# Patient Record
Sex: Female | Born: 2000 | Race: Black or African American | Hispanic: No | Marital: Single | Smoking: Never smoker
Health system: Southern US, Community
[De-identification: ages and names within clinical notes are randomized; demographics above are authoritative.]

---
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
## 2020-04-23 ENCOUNTER — Ambulatory Visit: Payer: Medicaid Other | Admitting: Pediatrics

## 2024-05-27 ENCOUNTER — Ambulatory Visit: Payer: Self-pay

## 2024-06-13 ENCOUNTER — Ambulatory Visit: Payer: Self-pay

## 2024-06-17 ENCOUNTER — Other Ambulatory Visit: Payer: Self-pay

## 2024-06-17 ENCOUNTER — Encounter: Payer: Self-pay | Admitting: Family Medicine

## 2024-06-17 ENCOUNTER — Ambulatory Visit: Payer: Self-pay | Admitting: Family Medicine

## 2024-06-17 VITALS — BP 131/73 | HR 120 | Ht 63.0 in | Wt 124.6 lb

## 2024-06-17 DIAGNOSIS — R6889 Other general symptoms and signs: Secondary | ICD-10-CM

## 2024-06-17 DIAGNOSIS — Z3201 Encounter for pregnancy test, result positive: Secondary | ICD-10-CM

## 2024-06-17 DIAGNOSIS — Z3A10 10 weeks gestation of pregnancy: Secondary | ICD-10-CM

## 2024-06-17 DIAGNOSIS — O219 Vomiting of pregnancy, unspecified: Secondary | ICD-10-CM

## 2024-06-17 LAB — POCT PREGNANCY, URINE: Preg Test, Ur: POSITIVE — AB

## 2024-06-17 MED ORDER — PROMETHAZINE HCL 25 MG PO TABS: 25.0000 mg | ORAL_TABLET | Freq: Four times a day (QID) | ORAL | 1 refills | Status: AC | PRN

## 2024-06-17 MED ORDER — GLYCOPYRROLATE 1 MG PO TABS: 1.0000 mg | ORAL_TABLET | Freq: Three times a day (TID) | ORAL | 0 refills | Status: AC

## 2024-06-17 NOTE — Patient Instructions (Addendum)
 Prenatal Care Providers           Center for [facility] @ [facility]  [address] [phone]  Center for [facility] @ [facility]   [address]  [phone]  [facility] @ [facility]       [address] [phone]            Center for [facility] @ [place]     [phone]          Center for [facility] @ [facility]   [address] [phone]  Center for [facility] @ [facility]  [address] [phone]     Center for [facility] Healthcare @ [address] [place])  [address]   [phone]     [facility]  Phone: [phone]  [facility]  Phone: [phone]  [facility] Phone: [phone]  [facility] Phone: [phone]  [facility] Phone: [phone]  [facility] Phone: [phone]  [facility]  Phone: [phone]    First Trimester of Pregnancy  The first trimester of pregnancy starts on the first day of your last monthly period until the end of week 13. This is months 1 through 3 of pregnancy. A week after a sperm fertilizes an egg, the egg will implant into the wall of the uterus and begin to develop into a baby. Body changes during your first trimester Your body goes through many changes during pregnancy. The changes usually return to normal after your baby is born. Physical changes Your breasts may grow larger and may hurt. The area around your nipples may get darker. Your periods will stop. Your hair and nails may grow faster. You may pee more often. Health changes You may tire easily. Your gums may bleed and may be sensitive when you brush and floss. You may not feel hungry. You may have heartburn. You may throw up or feel like you may throw up. You may want to eat some foods, but  not others. You may have headaches. You may have trouble pooping (constipation). Other changes Your emotions may change from day to day. You may have more dreams. Follow these instructions at home: Medicines Talk to your health care provider if you're taking medicines. Ask if the medicines are safe to take during pregnancy. Your provider may change the medicines that you take. Do not take any medicines unless told to by your provider. Take a prenatal vitamin that has at least 600 micrograms (mcg) of folic acid. Do not use herbal medicines, illegal substances, or medicines that are not approved by your provider. Eating and drinking While you're pregnant your body needs extra food for your growing baby. Talk with your provider about what to eat while pregnant. Activity Most women are able to exercise during pregnancy. Exercises may need to change as your pregnancy goes on. Talk to your provider about your activities and exercise routines. Relieving pain and discomfort Wear a good, supportive bra if your breasts hurt. Rest with your legs raised if you have leg cramps or low back pain. Safety Wear your seatbelt at all times when you're in a car. Talk to your provider if someone hits you, hurts you, or yells at you. Talk with your provider if you're feeling sad or have thoughts of hurting yourself. Lifestyle Certain things can be harmful while you're pregnant. Follow  these rules: Do not use hot tubs, steam rooms, or saunas. Do not douche. Do not use tampons or scented pads. Do not drink alcohol,smoke, vape, or use products with nicotine or tobacco in them. If you need help quitting, talk with your provider. Avoid cat litter boxes and soil used by cats. These things carry germs that can cause harm to your pregnancy and your baby. General instructions Keep all follow-up visits. It helps you and your unborn baby stay as healthy as possible. Write down your questions. Take them to your visits.  Your provider will: Talk with you about your overall health. Give you advice or refer you to specialists who can help with different needs, including: Prenatal education classes. Mental health and counseling. Foods and healthy eating. Ask for help if you need help with food. Call your dentist and ask to be seen. Brush your teeth with a soft toothbrush. Floss gently. Where to find more information American Pregnancy Association: americanpregnancy.org [employer]: acog.org Office on [employer] Health: travellesson.ca Contact a health care provider if: You feel dizzy, faint, or have a fever. You vomit or have watery poop (diarrhea) for 2 days or more. You have abnormal discharge or bleeding from your vagina. You have pain when you pee or your pee smells bad. You have cramps, pain, or pressure in your belly area. Get help right away if: You have trouble breathing or chest pain. You have any kind of injury, such as from a fall or a car crash. These symptoms may be an emergency. Get help right away. Call 911. Do not wait to see if the symptoms will go away. Do not drive yourself to the hospital. This information is not intended to replace advice given to you by your health care provider. Make sure you discuss any questions you have with your health care provider. Document Revised: 04/23/2023 Document Reviewed: 11/21/2022 Elsevier Patient Education  2024 Elsevier Inc.Morning Sickness Morning sickness is when you throw up or feel like you may throw up during pregnancy. This condition often occurs in the morning, but it can also occur at any time of day. Morning sickness is most common during the first three months of pregnancy, but it can go on throughout the pregnancy. Morning sickness is usually harmless. But if you throw up all the time, you should see your health care provider. You may also hear this condition called nausea and vomiting of  pregnancy. What are the causes? The cause of morning sickness is not known. It may be linked to changes in hormones during pregnancy. What increases the risk? You're more likely to have morning sickness if: You had morning sickness in another pregnancy. You're pregnant with more than one baby, such as twins. You had morning sickness in other pregnancies. You have had motion sickness before you were pregnant. You have had bad headaches or migraines before you were pregnant. What are the signs or symptoms? Symptoms of morning sickness include: Feeling like you may throw up. Throwing up. How is this diagnosed? Morning sickness is diagnosed based on your symptoms. How is this treated? Treatment is usually not needed for morning sickness. You may only need to change what you eat. In some cases, your provider may give you: Vitamin B6 supplements. Medicines to prevent throwing up. Ginger. Follow these instructions at home: Medicines Take your medicines only as told by your provider. Do not use any prescription, over-the-counter, or herbal medicines for morning sickness without first talking with your provider.  Take prenatal vitamins. These can stop or lessen the symptoms of morning sickness. If you feel like you may throw up after taking prenatal vitamins, take them at night or with a snack. Eating and drinking     Eat dry toast or crackers before getting out of bed. Eat 5 or 6 small meals a day. Try ginger ale made with real ginger, ginger tea, or ginger candies. Drink fluids throughout the day. Eat protein foods when you need a snack. Nuts, yogurt, and cheese are good choices. Eat dry and bland foods like rice or baked potatoes. Foods that are high in carbohydrates are often helpful. Have someone cook for you if the smell of food makes you want to throw up. Foods to avoid Greasy foods. Fatty foods. Spicy foods. General instructions Try to avoid smells that make you feel  sick. Use an air purifier to keep the air in your house free of smells. Try using an acupressure wristband. This is a wristband that's used to treat motion sickness. Try acupuncture. In this treatment, a provider puts thin needles into certain areas of your body to make you feel better. Brush your teeth after throwing up or rinse with a mix of baking soda and water. The acid in throw-up can hurt your teeth. Contact a health care provider if: Your symptoms do not get better. You feel dizzy or light-headed. You're losing weight. Get help right away if: The feeling that you may throw up will not go away, or you can't stop throwing up. You faint. You have very bad pain in your belly. This information is not intended to replace advice given to you by your health care provider. Make sure you discuss any questions you have with your health care provider. Document Revised: 04/23/2023 Document Reviewed: 10/30/2022 Elsevier Patient Education  2024 [employer].

## 2024-06-17 NOTE — Progress Notes (Signed)
 Here for pregnancy test which was positive. She declines interpreter and states she does not need interpreter. Speaks good English. She reports sure LMP 04/04/24 which makes her [redacted]w[redacted]d with EDD 01/09/25. Unable to get FHR with doppler. She denies any other pregnancies or medical issues. She reports spitting a lot and nausea and vomiting. Advised to start prenatal care with provider her choice; list placed in AVS. Advised to start prenatal vitamins.  Joesph Sear, PA in to welcome patient. She was able to see FHR with handheld ipad doppler.  She advised RX for Robinol and Phenergan- rx sent and explained to patient instructions. Sent patient to front desk to discuss options for prenatal care. She voices understanding. Rock Skip PEAK

## 2024-07-07 ENCOUNTER — Other Ambulatory Visit: Payer: Self-pay

## 2024-07-07 ENCOUNTER — Ambulatory Visit: Payer: Self-pay

## 2024-07-07 VITALS — BP 143/73 | HR 122 | Wt 126.0 lb

## 2024-07-07 DIAGNOSIS — Z3A13 13 weeks gestation of pregnancy: Secondary | ICD-10-CM

## 2024-07-07 DIAGNOSIS — O99011 Anemia complicating pregnancy, first trimester: Secondary | ICD-10-CM | POA: Diagnosis not present

## 2024-07-07 DIAGNOSIS — R7989 Other specified abnormal findings of blood chemistry: Secondary | ICD-10-CM

## 2024-07-07 DIAGNOSIS — Z3490 Encounter for supervision of normal pregnancy, unspecified, unspecified trimester: Secondary | ICD-10-CM | POA: Insufficient documentation

## 2024-07-07 DIAGNOSIS — Z3491 Encounter for supervision of normal pregnancy, unspecified, first trimester: Secondary | ICD-10-CM

## 2024-07-07 MED ORDER — PRENATAL PLUS VITAMIN/MINERAL 27-1 MG PO TABS: 1.0000 | ORAL_TABLET | Freq: Every day | ORAL | 11 refills | Status: AC

## 2024-07-07 NOTE — Patient Instructions (Signed)
 Safe Medications in Pregnancy   Acne:  Benzoyl Peroxide  Salicylic Acid   Backache/Headache:  Tylenol: 2 regular strength every 4 hours OR               2 Extra strength every 6 hours   Colds/Coughs/Allergies:  Benadryl (alcohol free) 25 mg every 6 hours as needed  Breath right strips  Claritin  Cepacol throat lozenges  Chloraseptic throat spray  Cold-Eeze- up to three times per day  Cough drops, alcohol free  Flonase (by prescription only)  Guaifenesin  Mucinex  Robitussin DM (plain only, alcohol free)  Saline nasal spray/drops  Sudafed (pseudoephedrine) & Actifed * use only after [redacted] weeks gestation and if you do not have high blood pressure  Tylenol  Vicks Vaporub  Zinc lozenges  Zyrtec   Constipation:  Colace  Ducolax suppositories  Fleet enema  Glycerin suppositories  Metamucil  Milk of magnesia  Miralax  Senokot  Smooth move tea   Diarrhea:  Kaopectate  Imodium A-D   *NO pepto Bismol   Hemorrhoids:  Anusol  Anusol HC  Preparation H  Tucks   Indigestion:  Tums  Maalox  Mylanta  Zantac   Pepcid   Insomnia:  Benadryl (alcohol free) 25mg  every 6 hours as needed  Tylenol PM  Unisom, no Gelcaps   Leg Cramps:  Tums  MagGel   Nausea/Vomiting:  Bonine  Dramamine  Emetrol  Ginger extract  Sea bands  Meclizine  Nausea medication to take during pregnancy:  Unisom (doxylamine succinate 25 mg tablets) Take one tablet daily at bedtime. If symptoms are not adequately controlled, the dose can be increased to a maximum recommended dose of two tablets daily (1/2 tablet in the morning, 1/2 tablet mid-afternoon and one at bedtime).  Vitamin B6 100mg  tablets. Take one tablet twice a day (up to 200 mg per day).   Skin Rashes:  Aveeno products  Benadryl cream or 25mg  every 6 hours as needed  Calamine Lotion  1% cortisone cream   Yeast infection:  Gyne-lotrimin 7  Monistat 7    **If taking multiple medications, please check labels to avoid  duplicating the same active ingredients  **take medication as directed on the label  ** Do not exceed 4000 mg of tylenol in 24 hours  **Do not take medications that contain aspirin or ibuprofen            Maternity Assessment [facility] --  Address: [address] [place], [place], [place] [address]

## 2024-07-07 NOTE — Progress Notes (Signed)
 New OB Intake  I connected with Rachel Davila  arrive in clinic on 07/07/24 at 11:15 AM EST in person and verified that I am speaking with the correct person using two identifiers.  I discussed the limitations, risks, security and privacy concerns of performing an evaluation and management service by telephone and the availability of in person appointments. I also discussed with the patient that there may be a patient responsible charge related to this service. The patient expressed understanding and agreed to proceed.  I explained I am completing New OB Intake today. We discussed EDD of 01/09/25 based on LMP of 04/04/24. Pt is G1P0. Patient has a dating and viability US  upon request for 07/18/24 at 1015. I reviewed her allergies, medications and Medical/Surgical/OB history.    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 Initial BP was 141/74 HR: 120 and recheck BP was 143/73 HR: 122. Patient reports slight headaches with relief with hydration and some dizziness. Denies any chest pain, SOB, edema or rapid HR. Chart review with Dr. Nicholaus who states to obtain CMP, PCR urine and TSH and review MAU precautions with patient. Provider advised during next visit during her initial prenatal appointment to review blood pressure readings. Patient verbalized understanding and MAU address provided via AVS.   Delivery Plans Plans to deliver at [facility] Discussed the nature of our practice with multiple providers including residents and students as well as female and female providers. Due to the size of the practice, the delivering provider may not be the same as those providing prenatal care.   Patient is not interested in water birth.  MyChart/Babyscripts MyChart access verified. I explained pt will have some visits in  office and some virtually. Babyscripts instructions given and order placed. Patient verifies receipt of registration text/e-mail. Account successfully created and app downloaded. If patient is a candidate for Optimized scheduling, add to sticky note.   Blood Pressure Cuff/Weight Scale Blood pressure cuff ordered for patient to pick-up from [employer]. Explained after first prenatal appt pt will check weekly and document in Babyscripts. Patient does not have weight scale; order sent to [facility], patient may track weight weekly in Babyscripts.  Anatomy US  Explained first scheduled US  will be around 19 weeks. Anatomy US  scheduled for 08/22/2024 at 2:00PM.  Is patient a CenteringPregnancy candidate?  Declined    Is patient a Mom+Baby Combined Care candidate?  Patient is unsure.   If accepted, confirm patient does not intend to move from the area for at least 12 months, then notify Mom+Baby staff  Is patient a candidate for Babyscripts Optimization? Yes, patient declined   First visit review I reviewed new OB appt with patient. Explained pt will be seen by Dr. Herchel at first visit. Discussed [place] genetic screening with patient. Patient is unsure regarding Panorama and Horizon.. Routine prenatal labs is needed: Hemoglobin A1C, possible genetic screening   Last Pap No results found for: DIAGPAP Patient to get Pap smear during initial prenatal appointment.   Rosaline Pendleton, RN 07/07/2024  4:25 PM

## 2024-07-08 DIAGNOSIS — O99019 Anemia complicating pregnancy, unspecified trimester: Secondary | ICD-10-CM | POA: Insufficient documentation

## 2024-07-08 LAB — COMPREHENSIVE METABOLIC PANEL WITH GFR
ALT: 17 IU/L (ref 0–32)
AST: 21 IU/L (ref 0–40)
Albumin: 4.1 g/dL (ref 4.0–5.0)
Alkaline Phosphatase: 118 IU/L — ABNORMAL HIGH (ref 41–116)
BUN/Creatinine Ratio: 22 (ref 9–23)
BUN: 7 mg/dL (ref 6–20)
Bilirubin Total: 0.5 mg/dL (ref 0.0–1.2)
CO2: 19 mmol/L — ABNORMAL LOW (ref 20–29)
Calcium: 9.8 mg/dL (ref 8.7–10.2)
Chloride: 102 mmol/L (ref 96–106)
Creatinine, Ser: 0.32 mg/dL — ABNORMAL LOW (ref 0.57–1.00)
Globulin, Total: 2.8 g/dL (ref 1.5–4.5)
Glucose: 106 mg/dL — ABNORMAL HIGH (ref 70–99)
Potassium: 4.9 mmol/L (ref 3.5–5.2)
Sodium: 137 mmol/L (ref 134–144)
Total Protein: 6.9 g/dL (ref 6.0–8.5)
eGFR: 149 mL/min/1.73 (ref >59)

## 2024-07-08 LAB — CBC/D/PLT+RPR+RH+ABO+RUBIGG...
Antibody Screen: NEGATIVE
Basophils Absolute: 0 x10E3/uL (ref 0.0–0.2)
Basos: 0 %
EOS (ABSOLUTE): 0 x10E3/uL (ref 0.0–0.4)
Eos: 0 %
HCV Ab: NONREACTIVE
HIV Screen 4th Generation wRfx: NONREACTIVE
Hematocrit: 33.1 % — ABNORMAL LOW (ref 34.0–46.6)
Hemoglobin: 10.8 g/dL — ABNORMAL LOW (ref 11.1–15.9)
Hepatitis B Surface Ag: NEGATIVE
Immature Grans (Abs): 0 x10E3/uL (ref 0.0–0.1)
Immature Granulocytes: 0 %
Lymphocytes Absolute: 1.1 x10E3/uL (ref 0.7–3.1)
Lymphs: 18 %
MCH: 27.8 pg (ref 26.6–33.0)
MCHC: 32.6 g/dL (ref 31.5–35.7)
MCV: 85 fL (ref 79–97)
Monocytes Absolute: 0.7 x10E3/uL (ref 0.1–0.9)
Monocytes: 11 %
Neutrophils Absolute: 4.1 x10E3/uL (ref 1.4–7.0)
Neutrophils: 70 %
Platelets: 308 x10E3/uL (ref 150–450)
RBC: 3.88 x10E6/uL (ref 3.77–5.28)
RDW: 14.3 % (ref 11.7–15.4)
RPR Ser Ql: NONREACTIVE
Rh Factor: POSITIVE
Rubella Antibodies, IGG: 6.68 {index} (ref >0.99)
WBC: 5.9 x10E3/uL (ref 3.4–10.8)

## 2024-07-08 LAB — HCV INTERPRETATION

## 2024-07-08 LAB — TSH: TSH: <0.005 u[IU]/mL — ABNORMAL LOW (ref 0.450–4.500)

## 2024-07-08 MED ORDER — FERRIC MALTOL 30 MG PO CAPS: 1.0000 | ORAL_CAPSULE | Freq: Two times a day (BID) | ORAL | 2 refills | Status: AC

## 2024-07-08 NOTE — Addendum Note (Signed)
 Addended by: HERCHEL GRUMET A on: 07/08/2024 08:55 AM   Modules accepted: Orders

## 2024-07-09 LAB — PROTEIN / CREATININE RATIO, URINE
Creatinine, Urine: 93.4 mg/dL
Protein, Ur: 29.6 mg/dL
Protein/Creat Ratio: 317 mg/g{creat} — ABNORMAL HIGH (ref 0–200)

## 2024-07-09 LAB — CULTURE, OB URINE

## 2024-07-09 LAB — URINE CULTURE, OB REFLEX

## 2024-07-11 ENCOUNTER — Ambulatory Visit: Payer: Self-pay | Admitting: Obstetrics & Gynecology

## 2024-07-11 DIAGNOSIS — O1210 Gestational proteinuria, unspecified trimester: Secondary | ICD-10-CM | POA: Insufficient documentation

## 2024-07-12 ENCOUNTER — Encounter: Payer: Self-pay | Admitting: Obstetrics & Gynecology

## 2024-07-12 ENCOUNTER — Ambulatory Visit (INDEPENDENT_AMBULATORY_CARE_PROVIDER_SITE_OTHER): Payer: Self-pay | Admitting: Obstetrics & Gynecology

## 2024-07-12 ENCOUNTER — Other Ambulatory Visit: Payer: Self-pay

## 2024-07-12 ENCOUNTER — Other Ambulatory Visit (HOSPITAL_COMMUNITY)
Admission: RE | Admit: 2024-07-12 | Discharge: 2024-07-12 | Disposition: A | Discharge status: Home / Self Care | Source: Ambulatory Visit | Attending: Obstetrics & Gynecology | Admitting: Obstetrics & Gynecology

## 2024-07-12 VITALS — BP 124/77 | HR 86 | Wt 125.0 lb

## 2024-07-12 DIAGNOSIS — Z3492 Encounter for supervision of normal pregnancy, unspecified, second trimester: Secondary | ICD-10-CM

## 2024-07-12 DIAGNOSIS — O1212 Gestational proteinuria, second trimester: Secondary | ICD-10-CM

## 2024-07-12 DIAGNOSIS — Z3A14 14 weeks gestation of pregnancy: Secondary | ICD-10-CM

## 2024-07-12 DIAGNOSIS — O26892 Other specified pregnancy related conditions, second trimester: Secondary | ICD-10-CM

## 2024-07-12 DIAGNOSIS — O99012 Anemia complicating pregnancy, second trimester: Secondary | ICD-10-CM | POA: Diagnosis not present

## 2024-07-12 DIAGNOSIS — N898 Other specified noninflammatory disorders of vagina: Secondary | ICD-10-CM | POA: Diagnosis not present

## 2024-07-12 DIAGNOSIS — O162 Unspecified maternal hypertension, second trimester: Secondary | ICD-10-CM | POA: Diagnosis not present

## 2024-07-12 DIAGNOSIS — O169 Unspecified maternal hypertension, unspecified trimester: Secondary | ICD-10-CM | POA: Insufficient documentation

## 2024-07-12 DIAGNOSIS — O1210 Gestational proteinuria, unspecified trimester: Secondary | ICD-10-CM

## 2024-07-12 LAB — ANEMIA PROFILE B
Ferritin: 319 ng/mL — ABNORMAL HIGH (ref 15–150)
Folate: >20 ng/mL (ref >3.0)
Iron Saturation: 47 % (ref 15–55)
Iron: 119 ug/dL (ref 27–159)
Total Iron Binding Capacity: 251 ug/dL (ref 250–450)
UIBC: 132 ug/dL (ref 131–425)
Vitamin B-12: 852 pg/mL (ref 232–1245)

## 2024-07-12 LAB — T3, FREE: T3, Free: 27 pg/mL (ref 2.0–4.4)

## 2024-07-12 LAB — T4, FREE: Free T4: 5.81 ng/dL — ABNORMAL HIGH (ref 0.82–1.77)

## 2024-07-12 LAB — HGB A1C W/O EAG: Hgb A1c MFr Bld: 5 % (ref 4.8–5.6)

## 2024-07-12 LAB — SPECIMEN STATUS REPORT

## 2024-07-12 MED ORDER — ASPIRIN 81 MG PO TBEC: 81.0000 mg | DELAYED_RELEASE_TABLET | Freq: Every day | ORAL | 2 refills | Status: AC

## 2024-07-12 NOTE — Progress Notes (Signed)
 INITIAL PRENATAL VISIT  History:  Rachel Davila is a 23 y.o. G1P0 at [redacted]w[redacted]d by LMP being seen today for her first obstetrical visit.  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 Patient does intend to breast feed. Pregnancy history fully reviewed.  Patient reports no complaints.  HISTORY: OB History  Gravida Para Term Preterm AB Living  1 0 0 0 0 0  SAB IAB Ectopic Multiple Live Births  0 0 0 0 0    # Outcome Date GA Lbr Len/2nd Weight Sex Type Anes PTL Lv  1 Current           Never had a pap smear or HPV vaccine.  Past Medical History:  Diagnosis Date   Dysfunctional uterine bleeding 10/07/2017   Dysmenorrhea in adolescent 10/07/2017   [facility] examination 03/02/2016   Seen at [facility] 09/21/2014  Positive TB spot, normal CXR  Need to check records with TB clinic.  Hep B sAg & core Ab: negative  Hep B S Ab: positive  HIV negative  Hgb electrophoresis: negative  Hb/Hct: 12.7/39.2  Lead: 1.07     Urine GC/Chlam- negative  RPR - negative        TB (pulmonary tuberculosis) 2016   Unintentional weight loss 10/07/2017   History reviewed. No pertinent surgical history. History reviewed. No pertinent family history. Social History   Tobacco Use   Smoking status: Never   Smokeless tobacco: Never  Vaping Use   Vaping status: Never Used  Substance Use Topics   Alcohol use: Not Currently   Drug use: Never   No Known Allergies Current Outpatient Medications on File Prior to Visit  Medication Sig Dispense Refill   Ferric Maltol  30 MG CAPS Take 1 capsule (30 mg total) by mouth 2 (two) times daily. Please take one hour before breakfast and dinner 60 capsule 2   glycopyrrolate  (ROBINUL ) 1 MG tablet Take 1 tablet (1 mg total) by mouth 3 (three) times daily. 90 tablet 0   Prenatal Vit-Fe Fumarate-FA (MULTIVITAMIN-PRENATAL) 27-0.8 MG TABS tablet Take 1 tablet by mouth daily at 12 noon. (Patient not taking: Reported on 07/12/2024)     Prenatal  Vit-Fe Fumarate-FA (PRENATAL PLUS VITAMIN/MINERAL) 27-1 MG TABS Take 1 tablet by mouth daily. (Patient not taking: Reported on 07/12/2024) 30 tablet 11   promethazine  (PHENERGAN ) 25 MG tablet Take 1 tablet (25 mg total) by mouth every 6 (six) hours as needed for nausea or vomiting. (Patient not taking: Reported on 07/12/2024) 30 tablet 1   ranitidine  (ZANTAC  75) 75 MG tablet Take 1 tablet (75 mg total) by mouth 2 (two) times daily. (Patient not taking: Reported on 07/12/2024) 60 tablet 1   No current facility-administered medications on file prior to visit.   Review of Systems Pertinent items noted in HPI and remainder of comprehensive ROS otherwise negative.  Indications for ASA therapy (per UpToDate) Two or more of the following: (Can do 81 mg daily) Nulliparity Yes Sociodemographic characteristics (African/African American race, low socioeconomic level) Yes  Physical Exam: Chaperone Rosaline Pendleton, RN   Vitals:   07/12/24 1010  BP: 124/77  Pulse: 86  Weight: 125 lb (56.7 kg)   Fetal Heart Rate (bpm): 164    General: well-developed, well-nourished female in no acute distress  Breasts:  normal appearance, no masses or tenderness bilaterally, exam done in the presence of a chaperone.   Skin: normal coloration and turgor, no rashes  Neurologic: oriented, normal, negative, normal mood  Extremities: normal strength, tone, and muscle mass, ROM of all joints is normal  HEENT PERRLA, extraocular movement intact and sclera clear, anicteric  Neck supple and no masses  Cardiovascular: regular rate and rhythm  Respiratory:  no respiratory distress, normal breath sounds  Abdomen: soft, non-tender; bowel sounds normal; no masses,  no organomegaly  Pelvic: normal external genitalia, no lesions, normal vaginal mucosa, copious yellow vaginal discharge seen and testing sample obtained, cervix with erythematous surface, pap smear done that resulted in bleeding and ameliorated with silver nitrate. Exam  done in the presence of a chaperone.    Results for orders placed or performed in visit on 07/07/24 (from the past 2 weeks)  CBC/D/Plt+RPR+Rh+ABO+RubIgG...   Collection Time: 07/07/24 12:36 PM  Result Value Ref Range   Hepatitis B Surface Ag Negative Negative   HCV Ab Non Reactive Non Reactive   RPR Ser Ql Non Reactive Non Reactive   Rubella Antibodies, IGG 6.68 Immune >0.99 index   ABO Grouping O    Rh Factor Positive    Antibody Screen Negative Negative   HIV Screen 4th Generation wRfx Non Reactive Non Reactive   WBC 5.9 3.4 - 10.8 x10E3/uL   RBC 3.88 3.77 - 5.28 x10E6/uL   Hemoglobin 10.8 (L) 11.1 - 15.9 g/dL   Hematocrit 66.8 (L) 65.9 - 46.6 %   MCV 85 79 - 97 fL   MCH 27.8 26.6 - 33.0 pg   MCHC 32.6 31.5 - 35.7 g/dL   RDW 85.6 88.2 - 84.5 %   Platelets 308 150 - 450 x10E3/uL   Neutrophils 70 Not Estab. %   Lymphs 18 Not Estab. %   Monocytes 11 Not Estab. %   Eos 0 Not Estab. %   Basos 0 Not Estab. %   Neutrophils Absolute 4.1 1.4 - 7.0 x10E3/uL   Lymphocytes Absolute 1.1 0.7 - 3.1 x10E3/uL   Monocytes Absolute 0.7 0.1 - 0.9 x10E3/uL   EOS (ABSOLUTE) 0.0 0.0 - 0.4 x10E3/uL   Basophils Absolute 0.0 0.0 - 0.2 x10E3/uL   Immature Granulocytes 0 Not Estab. %   Immature Grans (Abs) 0.0 0.0 - 0.1 x10E3/uL  Comp Met (CMET)   Collection Time: 07/07/24 12:36 PM  Result Value Ref Range   Glucose 106 (H) 70 - 99 mg/dL   BUN 7 6 - 20 mg/dL   Creatinine, Ser 9.67 (L) 0.57 - 1.00 mg/dL   eGFR 850 >40 fO/fpw/8.26   BUN/Creatinine Ratio 22 9 - 23   Sodium 137 134 - 144 mmol/L   Potassium 4.9 3.5 - 5.2 mmol/L   Chloride 102 96 - 106 mmol/L   CO2 19 (L) 20 - 29 mmol/L   Calcium 9.8 8.7 - 10.2 mg/dL   Total Protein 6.9 6.0 - 8.5 g/dL   Albumin 4.1 4.0 - 5.0 g/dL   Globulin, Total 2.8 1.5 - 4.5 g/dL   Bilirubin Total 0.5 0.0 - 1.2 mg/dL   Alkaline Phosphatase 118 (H) 41 - 116 IU/L   AST 21 0 - 40 IU/L   ALT 17 0 - 32 IU/L  TSH   Collection Time: 07/07/24 12:36 PM  Result  Value Ref Range   TSH <0.005 (L) 0.450 - 4.500 uIU/mL  Interpretation:   Collection Time: 07/07/24 12:36 PM  Result Value Ref Range   HCV Interp 1: Comment   Culture, OB Urine   Collection Time: 07/07/24 12:45 PM   Specimen: Urine   UC  Result Value Ref Range   Urine Culture, OB Final  report   Urine Culture, OB Reflex   Collection Time: 07/07/24 12:45 PM  Result Value Ref Range   Organism ID, Bacteria Comment   Protein / creatinine ratio, urine   Collection Time: 07/07/24 12:46 PM  Result Value Ref Range   Creatinine, Urine 93.4 Not Estab. mg/dL   Protein, Ur 70.3 Not Estab. 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 Vaginal discharge during pregnancy in second trimester - Cervicovaginal ancillary done, will follow up results and manage accordingly.  2. Anemia during pregnancy in second trimester Hemoglobin 10.8, oral iron prescribed (Accrufer ).  3. Elevated blood pressure during intake visit, antepartum BP Readings from Last 3 Encounters:  07/12/24 124/77  07/07/24 (!) 143/73  06/17/24 131/73  Normotensive today, continue to montior closely.  4. Proteinuria during pregnancy Urine Pr:Cr was 317 at 13 weeks, continue to monitor BP closely and recheck as needed. BUN/Cr 7/0.32. No history of any renal disorders, but will monitor closely  5. [redacted] weeks gestation of pregnancy 6. Encounter for supervision of low-risk pregnancy in second trimester (Primary) Remaining prenatal labs, pap done; will follow up results and manage accordingly. - Cytology - PAP - HORIZON Basic Panel - PANORAMA PRENATAL TEST - Hemoglobin A1c - Anemia  Profile B - T4, free - aspirin  EC 81 MG tablet; Take 1 tablet (81 mg total) by mouth at bedtime. Start taking when you are [redacted] weeks pregnant for rest of pregnancy for prevention of preeclampsia  Dispense: 300 tablet; Refill: 2 - T3, free - Enroll Patient in PreNatal Babyscripts - Babyscripts Schedule Optimization - Cervicovaginal ancillary only  Continue prenatal vitamins. Problem list reviewed and updated. Genetic Screening discussed, Panorama and Horizon: ordered. Ultrasound discussed; fetal anatomic survey: scheduled. Anticipatory guidance about prenatal visits given including labs, ultrasounds, and testing. Weight gain recommendations per IOM guidelines reviewed: underweight/BMI 18.5 or less > 28 - 40 lbs; normal weight/BMI 18.5 - 24.9 > 25 - 35 lbs; overweight/BMI 25 - 29.9 > 15 - 25 lbs; obese/BMI 30 or more > 11 - 20 lbs. Discussed usage of the Babyscripts app for more information about pregnancy, and to track blood pressures. Also discussed usage of virtual visits as additional source of managing and completing prenatal visits.  Patient was encouraged to use MyChart to review results, send requests, and have questions addressed.   The nature of [facility] with multiple MDs and Advanced Practice Providers was explained to patient; also emphasized that residents, students are part of our team. Routine obstetric precautions reviewed. Encouraged to seek out care at our office or emergency room [facility] preferred) for urgent and/or emergent concerns. Return in about 4 weeks (around 08/09/2024) for OFFICE OB VISIT (MD or APP).     GLORIS HUGGER, MD, FACOG Obstetrician & Gynecologist, [facility] for [employer], [facility]

## 2024-07-13 ENCOUNTER — Ambulatory Visit: Payer: Self-pay | Admitting: Obstetrics & Gynecology

## 2024-07-13 DIAGNOSIS — E0590 Thyrotoxicosis, unspecified without thyrotoxic crisis or storm: Secondary | ICD-10-CM

## 2024-07-13 DIAGNOSIS — O0992 Supervision of high risk pregnancy, unspecified, second trimester: Secondary | ICD-10-CM

## 2024-07-13 DIAGNOSIS — O23592 Infection of other part of genital tract in pregnancy, second trimester: Secondary | ICD-10-CM

## 2024-07-13 LAB — ANEMIA PROFILE B
Basophils Absolute: 0 x10E3/uL (ref 0.0–0.2)
Basos: 0 %
EOS (ABSOLUTE): 0.1 x10E3/uL (ref 0.0–0.4)
Eos: 1 %
Ferritin: 276 ng/mL — ABNORMAL HIGH (ref 15–150)
Folate: 13.3 ng/mL (ref >3.0)
Hematocrit: 32.2 % — ABNORMAL LOW (ref 34.0–46.6)
Hemoglobin: 10.2 g/dL — ABNORMAL LOW (ref 11.1–15.9)
Immature Grans (Abs): 0 x10E3/uL (ref 0.0–0.1)
Immature Granulocytes: 0 %
Iron Saturation: 32 % (ref 15–55)
Iron: 84 ug/dL (ref 27–159)
Lymphocytes Absolute: 1.6 x10E3/uL (ref 0.7–3.1)
Lymphs: 21 %
MCH: 27.2 pg (ref 26.6–33.0)
MCHC: 31.7 g/dL (ref 31.5–35.7)
MCV: 86 fL (ref 79–97)
Monocytes Absolute: 0.9 x10E3/uL (ref 0.1–0.9)
Monocytes: 11 %
Neutrophils Absolute: 5.2 x10E3/uL (ref 1.4–7.0)
Neutrophils: 67 %
Platelets: 290 x10E3/uL (ref 150–450)
RBC: 3.75 x10E6/uL — ABNORMAL LOW (ref 3.77–5.28)
RDW: 15 % (ref 11.7–15.4)
Retic Ct Pct: 1.6 % (ref 0.6–2.6)
Total Iron Binding Capacity: 261 ug/dL (ref 250–450)
UIBC: 177 ug/dL (ref 131–425)
Vitamin B-12: 810 pg/mL (ref 232–1245)
WBC: 7.9 x10E3/uL (ref 3.4–10.8)

## 2024-07-13 LAB — CERVICOVAGINAL ANCILLARY ONLY
Bacterial Vaginitis (gardnerella): POSITIVE — AB
Candida Glabrata: NEGATIVE
Candida Vaginitis: POSITIVE — AB
Chlamydia: NEGATIVE
Comment: NEGATIVE
Comment: NEGATIVE
Comment: NEGATIVE
Comment: NEGATIVE
Comment: NEGATIVE
Comment: NORMAL
Neisseria Gonorrhea: NEGATIVE
Trichomonas: NEGATIVE

## 2024-07-13 LAB — HEMOGLOBIN A1C
Est. average glucose Bld gHb Est-mCnc: 97 mg/dL
Hgb A1c MFr Bld: 5 % (ref 4.8–5.6)

## 2024-07-13 LAB — T4, FREE: Free T4: 5.03 ng/dL — ABNORMAL HIGH (ref 0.82–1.77)

## 2024-07-13 LAB — T3, FREE: T3, Free: 24.3 pg/mL (ref 2.0–4.4)

## 2024-07-14 ENCOUNTER — Encounter: Payer: Self-pay | Admitting: Obstetrics & Gynecology

## 2024-07-14 DIAGNOSIS — E0590 Thyrotoxicosis, unspecified without thyrotoxic crisis or storm: Secondary | ICD-10-CM | POA: Insufficient documentation

## 2024-07-14 LAB — CYTOLOGY - PAP
Comment: NEGATIVE
Diagnosis: NEGATIVE
High risk HPV: NEGATIVE

## 2024-07-14 MED ORDER — METHIMAZOLE 10 MG PO TABS: 10.0000 mg | ORAL_TABLET | Freq: Every day | ORAL | 6 refills | Status: DC

## 2024-07-14 MED ORDER — METRONIDAZOLE 500 MG PO TABS: 500.0000 mg | ORAL_TABLET | Freq: Two times a day (BID) | ORAL | 0 refills | Status: AC

## 2024-07-14 MED ORDER — TERCONAZOLE 0.4 % VA CREA: 1.0000 | TOPICAL_CREAM | Freq: Every day | VAGINAL | 0 refills | Status: AC

## 2024-07-14 NOTE — Progress Notes (Signed)
 Patient has hyperthyroidism.  She will be started on Methimazole 10 mg daily, and referral made to Endocrinology.  Thyroid tests to be rechecked around 18 weeks.  Please call to inform patient of results and recommendations.  She does speak adequate English and did not want an interpreter during her initial OB visit, interpreter services not needed (unless she asks for them).     GLORIS HUGGER, MD, FACOG Obstetrician & Gynecologist, [facility] for [employer], [facility]

## 2024-07-14 NOTE — Telephone Encounter (Addendum)
 Attempted to contact pt about results, left voicemail with office callback number.     [place], RN     ----- Message from Gloris Hugger, MD sent at 07/14/2024  8:01 AM EST ----- Patient has hyperthyroidism.  She will be started on Methimazole 10 mg daily, and referral made to Endocrinology.  Thyroid tests to be rechecked around 18 weeks.  Please call to inform patient of  results and recommendations.  She does speak adequate English and did not want an interpreter during her initial OB visit, interpreter services not needed (unless she asks for them).     GLORIS HUGGER, MD, FACOG Obstetrician & Gynecologist, [facility] for [employer], [facility]

## 2024-07-14 NOTE — Addendum Note (Signed)
 Addended by: MICHAE LOA CROME on: 07/14/2024 03:11 PM   Modules accepted: Orders

## 2024-07-14 NOTE — Telephone Encounter (Signed)
 Called pt and discussed abnormal test results. She declined use of interpreter. She was informed of vaginal yeast infection and bacterial vaginosis. Prescriptions have been sent to her pharmacy for treatment - dose instructions reviewed. Pt was also informed of abnormal thyroid function test which requires daily medication. Prescription sent to pharmacy. She will also be referred to a specialist (Endocrinologist) in order to learn more about this abnormality and treatment. Pt stated that she understood however has questions and there was an air of confusion in her voice. I stated that when she comes in for her ultrasound on 12/15, the doctor will speak with her and answer her questions. Meanwhile she will be contacted separately by the specialist's office to schedule referral appointment. Pt stated understanding.

## 2024-07-17 LAB — PANORAMA PRENATAL TEST FULL PANEL:PANORAMA TEST PLUS 5 ADDITIONAL MICRODELETIONS: FETAL FRACTION: 4.8

## 2024-07-18 ENCOUNTER — Other Ambulatory Visit

## 2024-07-24 LAB — HORIZON CUSTOM: REPORT SUMMARY: NEGATIVE

## 2024-07-25 ENCOUNTER — Encounter: Payer: Self-pay | Admitting: General Practice

## 2024-08-12 ENCOUNTER — Other Ambulatory Visit: Payer: Self-pay

## 2024-08-12 ENCOUNTER — Ambulatory Visit: Admitting: Family Medicine

## 2024-08-12 VITALS — BP 124/77 | HR 130 | Wt 127.2 lb

## 2024-08-12 DIAGNOSIS — O162 Unspecified maternal hypertension, second trimester: Secondary | ICD-10-CM | POA: Diagnosis not present

## 2024-08-12 DIAGNOSIS — E0590 Thyrotoxicosis, unspecified without thyrotoxic crisis or storm: Secondary | ICD-10-CM

## 2024-08-12 DIAGNOSIS — O0992 Supervision of high risk pregnancy, unspecified, second trimester: Secondary | ICD-10-CM

## 2024-08-12 DIAGNOSIS — Z3A18 18 weeks gestation of pregnancy: Secondary | ICD-10-CM

## 2024-08-12 DIAGNOSIS — O99282 Endocrine, nutritional and metabolic diseases complicating pregnancy, second trimester: Secondary | ICD-10-CM | POA: Diagnosis not present

## 2024-08-12 DIAGNOSIS — O169 Unspecified maternal hypertension, unspecified trimester: Secondary | ICD-10-CM

## 2024-08-12 DIAGNOSIS — O99012 Anemia complicating pregnancy, second trimester: Secondary | ICD-10-CM

## 2024-08-12 NOTE — Assessment & Plan Note (Signed)
 Normal blood pressure and reassuring FHR. Unremarkable genetic screen. Next appointment scheduled in 4 weeks.

## 2024-08-12 NOTE — Progress Notes (Addendum)
 "  PRENATAL VISIT NOTE  Subjective:  Rachel Davila is a 24 y.o. G1P0 at [redacted]w[redacted]d being seen today for ongoing prenatal care.  She is currently monitored for the following issues for this high-risk pregnancy and has Latent tuberculosis; Supervision of high-risk pregnancy; Anemia of mother in pregnancy; Proteinuria during pregnancy; Elevated blood pressure during intake visit, antepartum; and Hyperthyroidism in pregnancy, antepartum on their problem list.  Patient reports no complaints.  Contractions: Not present. Vag. Bleeding: None.  Movement: Absent. Denies leaking of fluid.   The following portions of the patient's history were reviewed and updated as appropriate: allergies, current medications, past family history, past medical history, past social history, past surgical history and problem list.   Objective:   Vitals:   08/12/24 0826  BP: 124/77  Pulse: (!) 130  Weight: 127 lb 3.2 oz (57.7 kg)    Fetal Status:  Fetal Heart Rate (bpm): 155   Movement: Absent    General: Alert, oriented and cooperative. Patient is in no acute distress.  Skin: Skin is warm and dry. No rash noted.   Cardiovascular: Normal heart rate noted  Respiratory: Normal respiratory effort, no problems with respiration noted  Abdomen: Soft, gravid, appropriate for gestational age.  Pain/Pressure: Absent     Pelvic: Cervical exam deferred        Extremities: Normal range of motion.  Edema: None  Mental Status: Normal mood and affect. Normal behavior. Normal judgment and thought content.      11/19/2017    9:30 AM 10/07/2017   12:38 PM  Depression screen PHQ 2/9  Decreased Interest  0  Down, Depressed, Hopeless 0 0  PHQ - 2 Score 0 0  Altered sleeping  3  Tired, decreased energy  3  Change in appetite  3  Feeling bad or failure about yourself   0  Trouble concentrating  2  Moving slowly or fidgety/restless  0  PHQ-9 Score  11      Data saved with a previous flowsheet row definition         No data to  display          Assessment and Plan:  Pregnancy: G1P0 at [redacted]w[redacted]d 1. Supervision of high risk pregnancy in second trimester (Primary) Normal blood pressure and reassuring FHT. Unremarkable genetic screen. Next appointment scheduled in 4 weeks.  - AFP, Serum, Open Spina Bifida - Thyroid  Panel With TSH  2. Hyperthyroidism in pregnancy, antepartum Low TSH and elevated T4 on labs drawn on 07/12/2024. Patient was started on methimazole  on 12/11. HR was elevated today (130). Patient does not seem to be adherent to medication. She was advised to take the medication regularly. TSH and T4 will be checked today, and may increase methimazole  dose if needed.  - Thyroid  Panel With TSH  3. Anemia during pregnancy in second trimester Low hemoglobin, normal iron, elevated ferritin, and negative thalassemia work up. Patient denies dizziness on standing, fatigue, chest pain, and SOB. Elevated ferritin could be due to chronically elevated thyroid  and supplementation. Will continue to monitor.   4. Elevated blood pressure during intake visit, antepartum Normal blood pressure today. Denies headaches, vision changes, and edema. Patient on daily Asprin for pre-eclampsia prevention. Will continue to monitor.   5. [redacted] weeks gestation of pregnancy Progressing well. Patient has no complaints. Fundus was just below the umbilicus. FHT are reassuring. Uptodate on prenatal screenings. Next appointment is in 4 weeks.    Preterm labor symptoms and general obstetric precautions including but not limited  to vaginal bleeding, contractions, leaking of fluid and fetal movement were reviewed in detail with the patient. Please refer to After Visit Summary for other counseling recommendations.   No follow-ups on file.  Future Appointments  Date Time Provider Department Center  09/05/2024  9:35 AM Ilean Norleen GAILS, MD [facility]  09/06/2024 10:00 AM [facility] PROVIDER 1 [facility]  09/06/2024 10:30 AM [facility] US1 [facility]   10/03/2024 10:15 AM Ilean Norleen GAILS, MD [facility]  10/17/2024  8:20 AM [facility] LAB [facility]  10/17/2024  9:15 AM [facility]-GENERAL 2 [facility]  10/31/2024 10:15 AM [facility]-GENERAL 2 [facility]  11/14/2024 10:55 AM [facility]-GENERAL 2 [facility]  11/28/2024 10:55 AM [facility]-GENERAL 2 [facility]  12/12/2024 10:55 AM W[facility]-GENERAL 2 [facility]  12/19/2024 10:15 AM [facility]-GENERAL 2 [facility]  12/27/2024  9:35 AM W[facility]-GENERAL 2 [facility]  01/02/2025 10:15 AM [facility]-GENERAL 2 [facility]  01/09/2025 10:15 AM [facility]-GENERAL 2 [facility]    Eliezer Dickens, Medical Student   Attending Attestation  I saw and evaluated the patient, performing the key elements of the service.I  personally performed or re-performed the history, physical exam, and medical decision making activities of this service and have verified that the service and findings are accurately documented in the student's note. I developed the management plan that is described in the student's note, and I agree with the content, with my edits above.    Steffan Ilean, MD Attending Family Medicine Physician, [facility] for [facility], [facility]   "

## 2024-08-12 NOTE — Assessment & Plan Note (Signed)
 Low TSH and elevated T4 on labs drawn on 07/12/2024. Patient was started on methimazole  on 12/11. HR was elevated today (130). Patient does not seem to be adherent to medication. She was advised to take the medication regularly. TSH and T4 will be checked today, and may increase methimazole  dose if needed.

## 2024-08-12 NOTE — Assessment & Plan Note (Signed)
 Normal blood pressure today. Denies headaches, vision changes, and edema. Patient on daily Asprin for pre-eclampsia prevention. Will continue to monitor.

## 2024-08-12 NOTE — Assessment & Plan Note (Signed)
 Low hemoglobin, normal iron, elevated ferritin, and negative thalassemia work up. Patient denies dizziness on standing, fatigue, chest pain, and SOB. Elevated ferritin could be due to chronically elevated thyroid  and supplementation. Will continue to monitor.

## 2024-08-15 ENCOUNTER — Ambulatory Visit: Payer: Self-pay | Admitting: Family Medicine

## 2024-08-15 ENCOUNTER — Encounter: Payer: Self-pay | Admitting: *Deleted

## 2024-08-15 LAB — THYROID PANEL WITH TSH
Free Thyroxine Index: 9.9 — ABNORMAL HIGH (ref 1.2–4.9)
T3 Uptake Ratio: 42 % — ABNORMAL HIGH (ref 24–39)
T4, Total: 23.5 ug/dL (ref 4.5–12.0)
TSH: <0.005 u[IU]/mL — ABNORMAL LOW (ref 0.450–4.500)

## 2024-08-15 LAB — AFP, SERUM, OPEN SPINA BIFIDA
AFP MoM: 0.95
AFP Value: 49.9 ng/mL
Gest. Age on Collection Date: 18 wk
Maternal Age At EDD: 25.2 a
OSBR Risk 1 IN: 10000
Test Results:: NEGATIVE
Weight: 127 [lb_av]

## 2024-08-22 ENCOUNTER — Ambulatory Visit

## 2024-08-22 ENCOUNTER — Other Ambulatory Visit

## 2024-08-25 MED ORDER — METHIMAZOLE 10 MG PO TABS: 10.0000 mg | ORAL_TABLET | Freq: Three times a day (TID) | ORAL | 6 refills | Status: AC

## 2024-08-25 NOTE — Telephone Encounter (Signed)
 Attempted to call the patient regarding results from thyroid  panel.  Will increase methimazole  to 10 mg 3 times a day.  MyChart message sent.

## 2024-09-05 ENCOUNTER — Encounter: Admitting: Family Medicine

## 2024-09-06 ENCOUNTER — Ambulatory Visit

## 2024-09-06 ENCOUNTER — Other Ambulatory Visit

## 2024-09-06 DIAGNOSIS — O169 Unspecified maternal hypertension, unspecified trimester: Secondary | ICD-10-CM

## 2024-09-06 DIAGNOSIS — O99012 Anemia complicating pregnancy, second trimester: Secondary | ICD-10-CM

## 2024-09-06 DIAGNOSIS — E0590 Thyrotoxicosis, unspecified without thyrotoxic crisis or storm: Secondary | ICD-10-CM

## 2024-09-06 DIAGNOSIS — O0992 Supervision of high risk pregnancy, unspecified, second trimester: Secondary | ICD-10-CM

## 2024-09-06 DIAGNOSIS — O1210 Gestational proteinuria, unspecified trimester: Secondary | ICD-10-CM

## 2024-10-03 ENCOUNTER — Encounter: Admitting: Family Medicine

## 2024-10-04 ENCOUNTER — Ambulatory Visit

## 2024-10-04 ENCOUNTER — Other Ambulatory Visit

## 2024-10-17 ENCOUNTER — Encounter: Admitting: Family Medicine

## 2024-10-17 ENCOUNTER — Other Ambulatory Visit

## 2024-10-31 ENCOUNTER — Encounter: Admitting: Family Medicine

## 2024-11-14 ENCOUNTER — Encounter

## 2024-11-28 ENCOUNTER — Encounter

## 2024-12-12 ENCOUNTER — Encounter

## 2024-12-19 ENCOUNTER — Encounter

## 2024-12-27 ENCOUNTER — Encounter

## 2025-01-02 ENCOUNTER — Encounter

## 2025-01-09 ENCOUNTER — Encounter
